# Patient Record
Sex: Male | Born: 2000 | Race: White | Hispanic: No | Smoking: Current some day smoker
Health system: Southern US, Community
[De-identification: ages and names within clinical notes are randomized; demographics above are authoritative.]

## PROBLEM LIST (undated history)

## (undated) HISTORY — PX: KNEE SURGERY: SHX244

---

## 2020-12-29 ENCOUNTER — Emergency Department (HOSPITAL_COMMUNITY)
Admission: EM | Admit: 2020-12-29 | Discharge: 2020-12-30 | Disposition: A | Discharge status: Home / Self Care | Payer: Self-pay | Attending: Emergency Medicine | Admitting: Emergency Medicine

## 2020-12-29 ENCOUNTER — Encounter (HOSPITAL_COMMUNITY): Payer: Self-pay | Admitting: *Deleted

## 2020-12-29 ENCOUNTER — Other Ambulatory Visit: Payer: Self-pay

## 2020-12-29 DIAGNOSIS — F17200 Nicotine dependence, unspecified, uncomplicated: Secondary | ICD-10-CM | POA: Insufficient documentation

## 2020-12-29 DIAGNOSIS — N200 Calculus of kidney: Secondary | ICD-10-CM | POA: Insufficient documentation

## 2020-12-29 LAB — CBC
HCT: 51.9 % (ref 39.0–52.0)
Hemoglobin: 16.8 g/dL (ref 13.0–17.0)
MCH: 26.3 pg (ref 26.0–34.0)
MCHC: 32.4 g/dL (ref 30.0–36.0)
MCV: 81.3 fL (ref 80.0–100.0)
Platelets: 275 10*3/uL (ref 150–400)
RBC: 6.38 MIL/uL — ABNORMAL HIGH (ref 4.22–5.81)
RDW: 12.6 % (ref 11.5–15.5)
WBC: 11 10*3/uL — ABNORMAL HIGH (ref 4.0–10.5)
nRBC: 0 % (ref 0.0–0.2)

## 2020-12-29 LAB — COMPREHENSIVE METABOLIC PANEL
ALT: 17 U/L (ref 0–44)
AST: 20 U/L (ref 15–41)
Albumin: 4.7 g/dL (ref 3.5–5.0)
Alkaline Phosphatase: 126 U/L (ref 38–126)
Anion gap: 9 (ref 5–15)
BUN: 10 mg/dL (ref 6–20)
CO2: 26 mmol/L (ref 22–32)
Calcium: 10.2 mg/dL (ref 8.9–10.3)
Chloride: 104 mmol/L (ref 98–111)
Creatinine, Ser: 0.83 mg/dL (ref 0.61–1.24)
GFR, Estimated: >60 mL/min (ref >60)
Glucose, Bld: 101 mg/dL — ABNORMAL HIGH (ref 70–99)
Potassium: 4 mmol/L (ref 3.5–5.1)
Sodium: 139 mmol/L (ref 135–145)
Total Bilirubin: 1.1 mg/dL (ref 0.3–1.2)
Total Protein: 8 g/dL (ref 6.5–8.1)

## 2020-12-29 LAB — URINALYSIS, ROUTINE W REFLEX MICROSCOPIC
Bilirubin Urine: NEGATIVE
Glucose, UA: NEGATIVE mg/dL
Ketones, ur: 80 mg/dL — AB
Leukocytes,Ua: NEGATIVE
Nitrite: NEGATIVE
Protein, ur: 30 mg/dL — AB
Specific Gravity, Urine: 1.03 (ref 1.005–1.030)
pH: 5 (ref 5.0–8.0)

## 2020-12-29 NOTE — ED Triage Notes (Signed)
Pt reports two days of intermittent right side back pain that radiates to abd. Denies urinary symptoms.

## 2020-12-30 ENCOUNTER — Emergency Department (HOSPITAL_COMMUNITY): Payer: Self-pay

## 2020-12-30 MED ORDER — SODIUM CHLORIDE 0.9 % BOLUS PEDS
1000.0000 mL | Freq: Once | INTRAVENOUS | Status: AC
  Administered 2020-12-30: 1000 mL via INTRAVENOUS

## 2020-12-30 MED ORDER — KETOROLAC TROMETHAMINE 30 MG/ML IJ SOLN
30.0000 mg | Freq: Once | INTRAMUSCULAR | Status: AC
  Administered 2020-12-30: 30 mg via INTRAVENOUS
  Filled 2020-12-30: qty 1

## 2020-12-30 MED ORDER — DICLOFENAC SODIUM ER 100 MG PO TB24: 100.0000 mg | ORAL_TABLET | Freq: Every day | ORAL | 1 refills | Status: AC

## 2020-12-30 MED ORDER — TAMSULOSIN HCL 0.4 MG PO CAPS: 0.4000 mg | ORAL_CAPSULE | Freq: Every day | ORAL | 1 refills | Status: AC

## 2020-12-30 NOTE — Discharge Instructions (Addendum)
Please return to the [facility] ED should you develop difficulty urinating, fevers, chills, severe abdominal pain.  Please follow-up with urologist listed if not improved within 3 to 4 days.

## 2020-12-30 NOTE — ED Provider Notes (Signed)
[facility] EMERGENCY DEPARTMENT Provider Note   CSN: [id number] Arrival date & time: 12/29/20  1528     History Chief Complaint  Patient presents with  . Back Pain  . Flank Pain    Darrell Zamora is a 20 y.o. male.  20 year old male who presents for 2 days of intermittent right-sided flank pain that radiates forward to the bladder.  Patient has not noticed any blood in his urine.  No dysuria.  No history of kidney stones.  No fevers.  No vomiting.  No abdominal pain.  Pain is intermittent.  Pain is helped by ibuprofen.  Ibuprofen wears off after approximately 4 hours.  No family history of kidney stones known.  The history is provided by the patient. No language interpreter was used.  Back Pain Pain location: right CVA. Quality:  Cramping and shooting Radiates to: right groin. Pain is:  Same all the time Onset quality:  Sudden Duration:  2 days Timing:  Intermittent Progression:  Waxing and waning Chronicity:  New Context: not occupational injury, not physical stress, not recent illness and not recent injury   Relieved by:  Ibuprofen Worsened by:  Nothing Ineffective treatments:  Ibuprofen Associated symptoms: no abdominal swelling, no bladder incontinence, no bowel incontinence, no chest pain, no dysuria, no fever, no headaches, no numbness, no paresthesias, no weakness and no weight loss   Risk factors: not obese and no steroid use   Flank Pain Pertinent negatives include no chest pain and no headaches.       History reviewed. No pertinent past medical history.  There are no problems to display for this patient.   History reviewed. No pertinent surgical history.     History reviewed. No pertinent family history.  Social History   Tobacco Use  . Smoking status: Current Some Day Smoker  Substance Use Topics  . Alcohol use: Never  . Drug use: Yes    Types: Marijuana    Home Medications Prior to Admission medications   Medication Sig  Start Date End Date Taking? Authorizing Provider  Diclofenac Sodium CR 100 MG 24 hr tablet Take 1 tablet (100 mg total) by mouth daily. 12/30/20  Yes Niel Hummer, MD  tamsulosin (FLOMAX) 0.4 MG CAPS capsule Take 1 capsule (0.4 mg total) by mouth daily for 28 days. 12/30/20 01/27/21 Yes Niel Hummer, MD    Allergies    Patient has no known allergies.  Review of Systems   Review of Systems  Constitutional: Negative for fever and weight loss.  Cardiovascular: Negative for chest pain.  Gastrointestinal: Negative for bowel incontinence.  Genitourinary: Positive for flank pain. Negative for bladder incontinence and dysuria.  Musculoskeletal: Positive for back pain.  Neurological: Negative for weakness, numbness, headaches and paresthesias.  All other systems reviewed and are negative.   Physical Exam Updated Vital Signs BP 134/73 (BP Location: Right Arm)   Pulse 75   Temp 98 F (36.7 C) (Oral)   Resp 16   SpO2 99%   Physical Exam Vitals and nursing note reviewed.  Constitutional:      Appearance: He is well-developed and well-nourished.  HENT:     Head: Normocephalic.     Right Ear: External ear normal.     Left Ear: External ear normal.     Mouth/Throat:     Mouth: Oropharynx is clear and moist.  Eyes:     Extraocular Movements: EOM normal.     Conjunctiva/sclera: Conjunctivae normal.  Cardiovascular:     Rate and  Rhythm: Normal rate.     Pulses: Intact distal pulses.     Heart sounds: Normal heart sounds.  Pulmonary:     Effort: Pulmonary effort is normal.     Breath sounds: Normal breath sounds.  Abdominal:     General: Bowel sounds are normal.     Palpations: Abdomen is soft.     Tenderness: There is abdominal tenderness. There is right CVA tenderness.     Comments: Mild tenderness to palpation along the right groin.  Patient also with pain to palpation of the right CVA.  Musculoskeletal:        General: Normal range of motion.     Cervical back: Normal range of  motion and neck supple.  Skin:    General: Skin is warm and dry.  Neurological:     Mental Status: He is alert and oriented to person, place, and time.     ED Results / Procedures / Treatments   Labs (all labs ordered are listed, but only abnormal results are displayed) Labs Reviewed  COMPREHENSIVE METABOLIC PANEL - Abnormal; Notable for the following components:      Result Value   Glucose, Bld 101 (*)    All other components within normal limits  CBC - Abnormal; Notable for the following components:   WBC 11.0 (*)    RBC 6.38 (*)    All other components within normal limits  URINALYSIS, ROUTINE W REFLEX MICROSCOPIC - Abnormal; Notable for the following components:   APPearance HAZY (*)    Hgb urine dipstick LARGE (*)    Ketones, ur 80 (*)    Protein, ur 30 (*)    Bacteria, UA RARE (*)    All other components within normal limits    EKG None  Radiology CT Renal Stone Study  Result Date: 12/30/2020 CLINICAL DATA:  Intermittent right back pain radiating to the abdomen. EXAM: CT ABDOMEN AND PELVIS WITHOUT CONTRAST TECHNIQUE: Multidetector CT imaging of the abdomen and pelvis was performed following the standard protocol without IV contrast. COMPARISON:  None. FINDINGS: Lower chest: The lung bases are clear. Hepatobiliary: No focal liver abnormality is seen. No gallstones, gallbladder wall thickening, or biliary dilatation. Pancreas: Unremarkable. No pancreatic ductal dilatation or surrounding inflammatory changes. Spleen: Normal in size without focal abnormality. Adrenals/Urinary Tract: No adrenal gland nodules. Kidneys are symmetrical. Mild right hydronephrosis without significant hydroureter. Stone in the distal right ureter at the ureterovesical junction measuring 2 mm diameter. Left kidney is normal. No bladder wall thickening or filling defects. Stomach/Bowel: Stomach is within normal limits. Appendix is not identified. No evidence of bowel wall thickening, distention, or  inflammatory changes. Vascular/Lymphatic: No significant vascular findings are present. No enlarged abdominal or pelvic lymph nodes. Reproductive: Prostate is unremarkable. Other: No abdominal wall hernia or abnormality. No abdominopelvic ascites. Musculoskeletal: No acute or significant osseous findings. IMPRESSION: 2 mm stone in the distal right ureter with mild proximal obstruction. Electronically Signed   By: Burman Nieves M.D.   On: 12/30/2020 02:46    Procedures Procedures   Medications Ordered in ED Medications  0.9% NaCl bolus PEDS (0 mLs Intravenous Stopped 12/30/20 0454)  ketorolac (TORADOL) 30 MG/ML injection 30 mg (30 mg Intravenous Given 12/30/20 0353)    ED Course  I have reviewed the triage vital signs and the nursing notes.  Pertinent labs & imaging results that were available during my care of the patient were reviewed by me and considered in my medical decision making (see chart for details).  MDM Rules/Calculators/A&P                          20 year old with right CVA tenderness radiating to the right groin.  No history of kidney stones but strong concern.  Will obtain CBC, CMP to evaluate renal function, will obtain CT.  Will obtain UA.  Will give a dose of Toradol and a normal saline bolus.  UA does show large blood, and 21-50 RBC.  No signs of infection.  Patient with normal renal function.  CT visualized by me patient noted to have a 2 mm mildly obstructing stone in the right UVJ.  Patient feeling better after IV fluid bolus and Toradol.  Will discharge home with Flomax, and Voltaren XR.  Will have patient strain urine.  Will have patient follow-up with urology.  Discussed signs that warrant reevaluation, encouraged patient to follow-up at [facility] if needed.  Dayron Coye was evaluated in Emergency Department on 12/30/2020 for the symptoms described in the history of present illness. He was evaluated in the context of the global COVID-19 pandemic,  which necessitated consideration that the patient might be at risk for infection with the SARS-CoV-2 virus that causes COVID-19. Institutional protocols and algorithms that pertain to the evaluation of patients at risk for COVID-19 are in a state of rapid change based on information released by regulatory bodies including the CDC and federal and state organizations. These policies and algorithms were followed during the patient's care in the ED.      Final Clinical Impression(s) / ED Diagnoses Final diagnoses:  Kidney stone on right side    Rx / DC Orders ED Discharge Orders         Ordered    tamsulosin (FLOMAX) 0.4 MG CAPS capsule  Daily        12/30/20 0444    Diclofenac Sodium CR 100 MG 24 hr tablet  Daily        12/30/20 0444           Niel Hummer, MD 12/30/20 0522

## 2021-06-27 ENCOUNTER — Other Ambulatory Visit: Payer: Self-pay

## 2021-06-27 ENCOUNTER — Emergency Department (HOSPITAL_COMMUNITY): Payer: PRIVATE HEALTH INSURANCE

## 2021-06-27 ENCOUNTER — Emergency Department (HOSPITAL_COMMUNITY)
Admission: EM | Admit: 2021-06-27 | Discharge: 2021-06-28 | Disposition: A | Discharge status: Home / Self Care | Payer: PRIVATE HEALTH INSURANCE | Attending: Emergency Medicine | Admitting: Emergency Medicine

## 2021-06-27 DIAGNOSIS — Y9367 Activity, basketball: Secondary | ICD-10-CM | POA: Diagnosis not present

## 2021-06-27 DIAGNOSIS — M7989 Other specified soft tissue disorders: Secondary | ICD-10-CM | POA: Diagnosis not present

## 2021-06-27 DIAGNOSIS — S93401A Sprain of unspecified ligament of right ankle, initial encounter: Secondary | ICD-10-CM | POA: Insufficient documentation

## 2021-06-27 DIAGNOSIS — X509XXA Other and unspecified overexertion or strenuous movements or postures, initial encounter: Secondary | ICD-10-CM | POA: Diagnosis not present

## 2021-06-27 DIAGNOSIS — F17200 Nicotine dependence, unspecified, uncomplicated: Secondary | ICD-10-CM | POA: Insufficient documentation

## 2021-06-27 DIAGNOSIS — S99911A Unspecified injury of right ankle, initial encounter: Secondary | ICD-10-CM | POA: Diagnosis present

## 2021-06-27 NOTE — ED Provider Notes (Signed)
Emergency Medicine Provider Triage Evaluation Note  Darrell Zamora , a 20 y.o. male  was evaluated in triage.  Pt complains of right ankle pain.  Patient was playing basketball and after jumping he landed on a friend's foot causing him to invert the right ankle.  Reports pain and swelling in the region.  Mild tingling radiating in the right foot.  No numbness.  No other regions of pain.  Physical Exam  BP 130/76   Pulse (!) 109   Temp 98.4 F (36.9 C) (Oral)   Resp 16   Ht 5\' 5"  (1.651 m)   Wt 48.1 kg   SpO2 99%   BMI 17.64 kg/m  Gen:   Awake, no distress   Resp:  Normal effort  MSK:   Moves extremities without difficulty  Other:  Exquisite tenderness with moderate swelling noted to the lateral malleolus of the right ankle.  Distal sensation intact.  2+ pedal pulses.  No proximal tib/fib tenderness appreciated.  Medical Decision Making  Medically screening exam initiated at 5:16 PM.  Appropriate orders placed.  Darrell Zamora was informed that the remainder of the evaluation will be completed by another provider, this initial triage assessment does not replace that evaluation, and the importance of remaining in the ED until their evaluation is complete.   Darrell Liverpool, PA-C 06/27/21 1717    06/29/21, MD 06/28/21 1109

## 2021-06-27 NOTE — ED Triage Notes (Signed)
Pt reports R ankle pain since coming down wrong on it during basketball game just PTA.

## 2021-06-28 MED ORDER — TRAMADOL HCL 50 MG PO TABS
50.0000 mg | ORAL_TABLET | Freq: Once | ORAL | Status: AC
  Administered 2021-06-28: 50 mg via ORAL
  Filled 2021-06-28: qty 1

## 2021-06-28 MED ORDER — NAPROXEN 250 MG PO TABS
500.0000 mg | ORAL_TABLET | Freq: Once | ORAL | Status: AC
  Administered 2021-06-28: 500 mg via ORAL
  Filled 2021-06-28: qty 2

## 2021-06-28 NOTE — Discharge Instructions (Signed)
Apply ice to areas of injury 3-4 times per day to limit inflammation/swelling.  Use crutches over the next 2-3 days to prevent from putting weight on your right ankle. Take 600mg  ibuprofen ever 6 hours for pain. Follow up with Orthopedics to ensure proper healing. Return to the ED for any new or concerning symptoms.

## 2021-06-28 NOTE — ED Provider Notes (Signed)
[facility] EMERGENCY DEPARTMENT Provider Note   CSN: [id number] Arrival date & time: 06/27/21  1630     History Chief Complaint  Patient presents with   Ankle Pain    Jonaven Kolar is a 20 y.o. male.  The history is provided by the patient. No language interpreter was used.  Ankle Pain Location:  Ankle Injury: yes   Mechanism of injury comment:  Fell and rolled ankle playing basketball Ankle location:  R ankle Pain details:    Quality:  Sharp and aching   Severity:  Moderate   Timing:  Constant   Progression:  Unchanged Chronicity:  New Prior injury to area:  No Relieved by:  Nothing Worsened by:  Bearing weight Ineffective treatments:  Rest Associated symptoms: decreased ROM, swelling and tingling   Associated symptoms: no numbness   Risk factors: no known bone disorder and no obesity       No past medical history on file.  There are no problems to display for this patient.   No past surgical history on file.     No family history on file.  Social History   Tobacco Use   Smoking status: Some Days  Substance Use Topics   Alcohol use: Never   Drug use: Yes    Types: Marijuana    Home Medications Prior to Admission medications   Medication Sig Start Date End Date Taking? Authorizing Provider  Diclofenac Sodium CR 100 MG 24 hr tablet Take 1 tablet (100 mg total) by mouth daily. 12/30/20   Niel Hummer, MD    Allergies    Patient has no known allergies.  Review of Systems   Review of Systems Ten systems reviewed and are negative for acute change, except as noted in the HPI.    Physical Exam Updated Vital Signs BP 130/88   Pulse 89   Temp 98.4 F (36.9 C) (Oral)   Resp 17   Ht 5\' 5"  (1.651 m)   Wt 48.1 kg   SpO2 98%   BMI 17.64 kg/m   Physical Exam Vitals and nursing note reviewed.  Constitutional:      General: He is not in acute distress.    Appearance: He is well-developed. He is not diaphoretic.     Comments:  Nontoxic appearing and in NAD  HENT:     Head: Normocephalic and atraumatic.  Eyes:     General: No scleral icterus.    Conjunctiva/sclera: Conjunctivae normal.  Cardiovascular:     Rate and Rhythm: Normal rate and regular rhythm.     Pulses: Normal pulses.     Comments: DP pulse 2+ in the RLE Pulmonary:     Effort: Pulmonary effort is normal. No respiratory distress.  Musculoskeletal:        General: Normal range of motion.     Cervical back: Normal range of motion.     Right ankle: Swelling present. No deformity. Tenderness present over the lateral malleolus.     Right Achilles Tendon: Normal.  Skin:    General: Skin is warm and dry.     Coloration: Skin is not pale.     Findings: No erythema or rash.  Neurological:     Mental Status: He is alert and oriented to person, place, and time.     Coordination: Coordination normal.     Comments: Sensation to light touch intact in the R foot. Patient able to wiggle all toes.  Psychiatric:        Behavior:  Behavior normal.    ED Results / Procedures / Treatments   Labs (all labs ordered are listed, but only abnormal results are displayed) Labs Reviewed - No data to display  EKG None  Radiology DG Ankle Complete Right  Result Date: 06/27/2021 CLINICAL DATA:  Fall with ankle pain. EXAM: RIGHT ANKLE - COMPLETE 3+ VIEW COMPARISON:  None. FINDINGS: A 3 mm ossific fragment inferior to the lateral malleolus may represent an avulsion fracture of the lateral malleolus. There is associated soft tissue swelling. No joint dislocation. IMPRESSION: Fragment inferior to the lateral malleolus may represent an avulsion fracture. Electronically Signed   By: Romona Curls M.D.   On: 06/27/2021 17:39    Procedures Procedures   Medications Ordered in ED Medications  traMADol (ULTRAM) tablet 50 mg (has no administration in time range)  naproxen (NAPROSYN) tablet 500 mg (has no administration in time range)    ED Course  I have reviewed the  triage vital signs and the nursing notes.  Pertinent labs & imaging results that were available during my care of the patient were reviewed by me and considered in my medical decision making (see chart for details).    MDM Rules/Calculators/A&P                           Patient presents to the emergency department for evaluation of R ankle pain. Patient neurovascularly intact on exam. Imaging negative for dislocation, bony deformity. Suspect very small avulsion fx associated with ligament injury. Compartments in the affected extremity are soft. Plan for supportive management including RICE NSAIDs, ASO, and crutches for WBAT; Orthopedic follow up as needed. Return precautions discussed and provided. Patient discharged in stable condition with no unaddressed concerns.   Final Clinical Impression(s) / ED Diagnoses Final diagnoses:  Sprain of right ankle, unspecified ligament, initial encounter    Rx / DC Orders ED Discharge Orders     None        Antony Madura, PA-C 06/28/21 0305    Palumbo, April, MD 06/28/21 5188

## 2021-09-30 IMAGING — CT CT RENAL STONE PROTOCOL
2 of 4 series · 16 of 46 positions shown, 18 images · non-contrast
Comparison: None.

CLINICAL DATA: Intermittent right back pain radiating to the
abdomen.

EXAM:
CT ABDOMEN AND PELVIS WITHOUT CONTRAST
TECHNIQUE: Multidetector CT imaging of the abdomen and pelvis was performed
following the standard protocol without IV contrast.

[Series 3: stone study 5.0 i30f 2 · axial · 0.61mm/px · z∈[+716,+1086]mm · 13 of 82 slices shown, 15 images]
[im 4/82  soft-tissue]
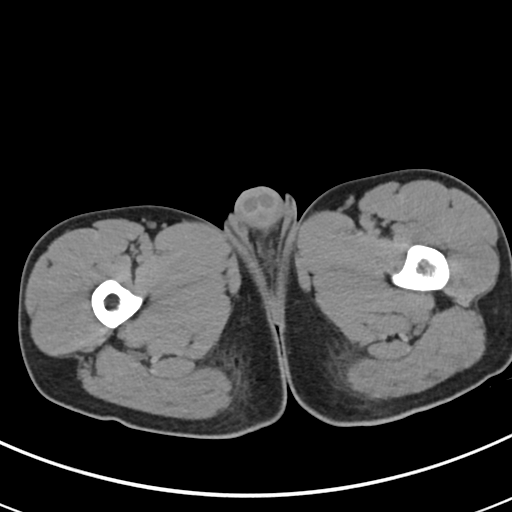
[im 4/82  bone]
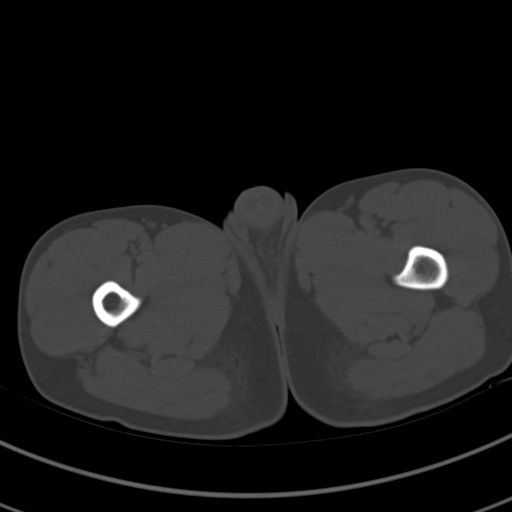
[im 10/82  soft-tissue]
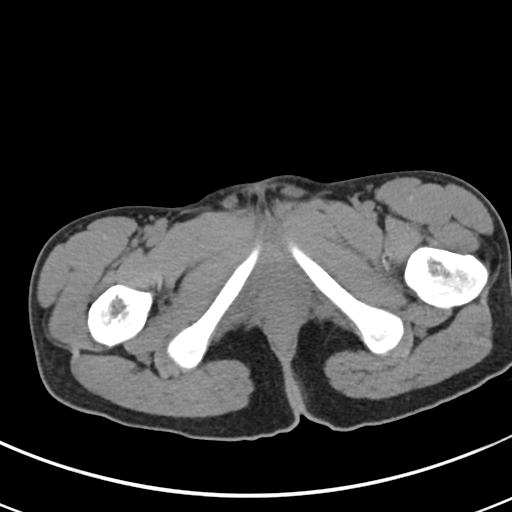
[im 17/82  soft-tissue]
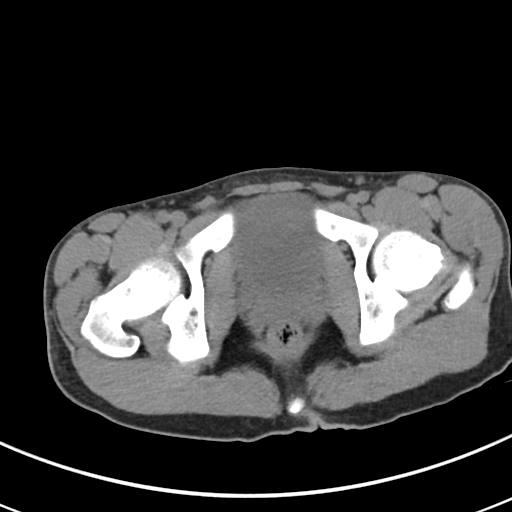
[im 23/82  soft-tissue]
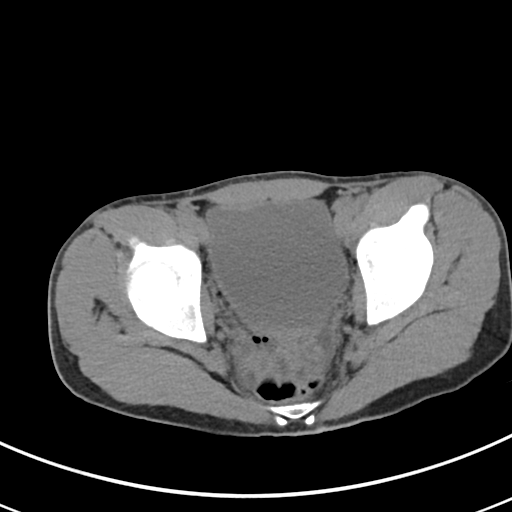
[im 30/82  soft-tissue]
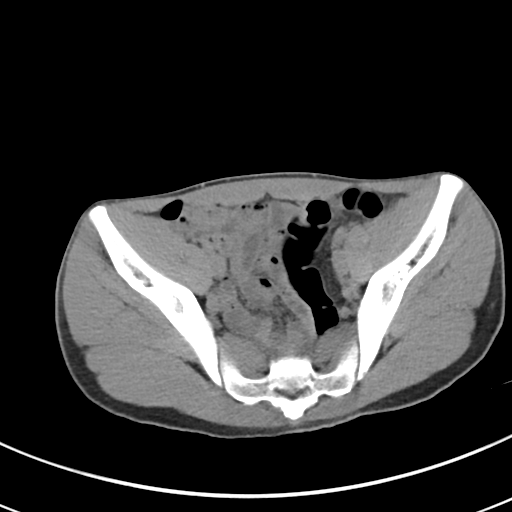
[im 36/82  soft-tissue]
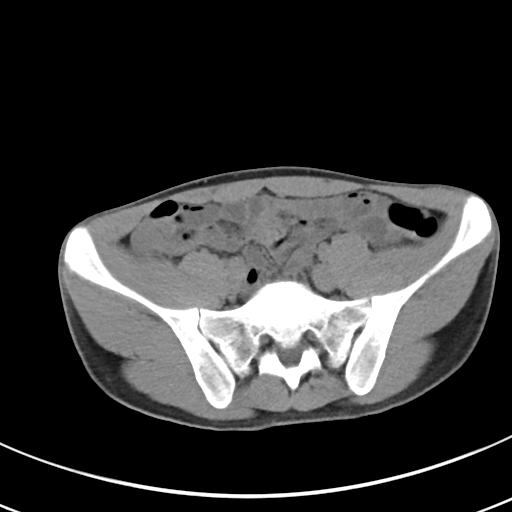
[im 43/82  soft-tissue]
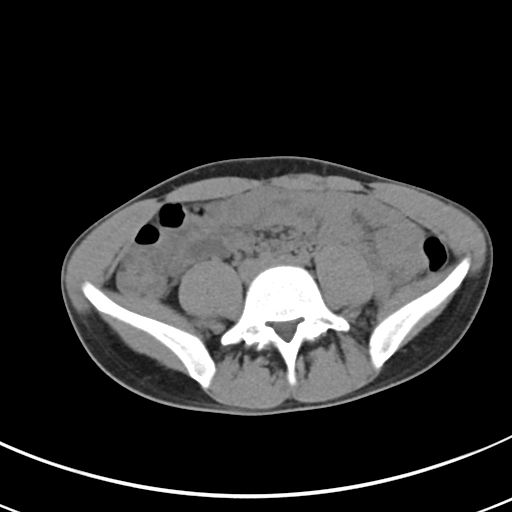
[im 46/82  soft-tissue]
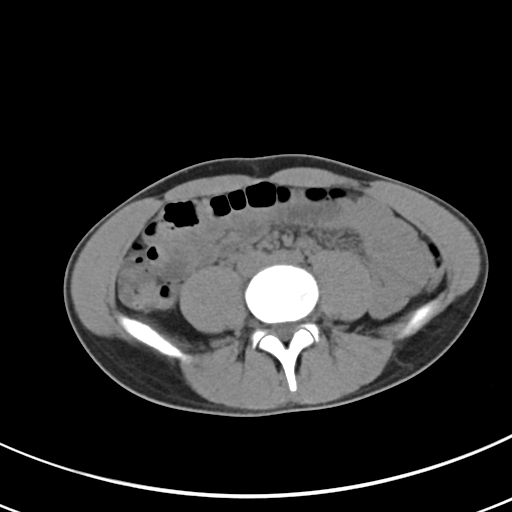
[im 52/82  soft-tissue]
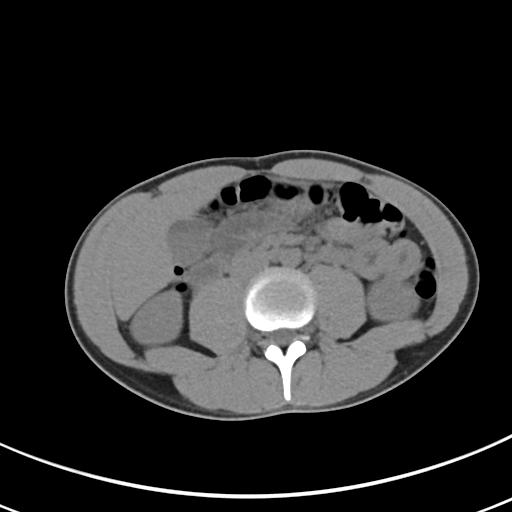
[im 52/82  bone]
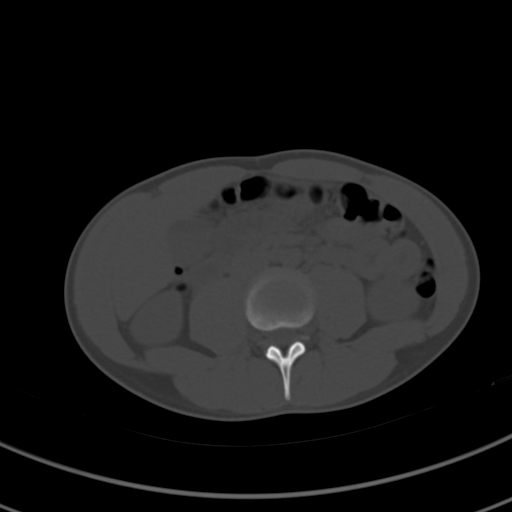
[im 59/82  soft-tissue]
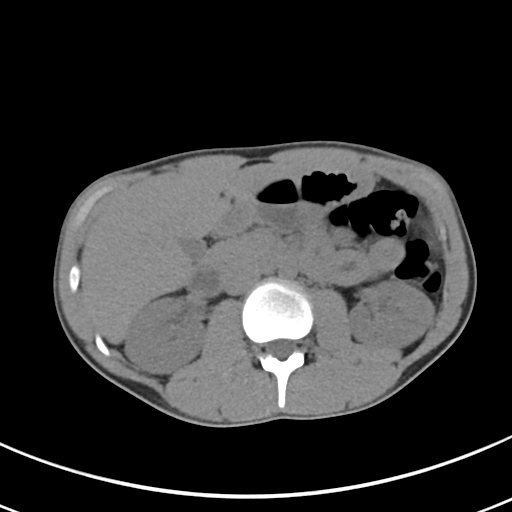
[im 65/82  soft-tissue]
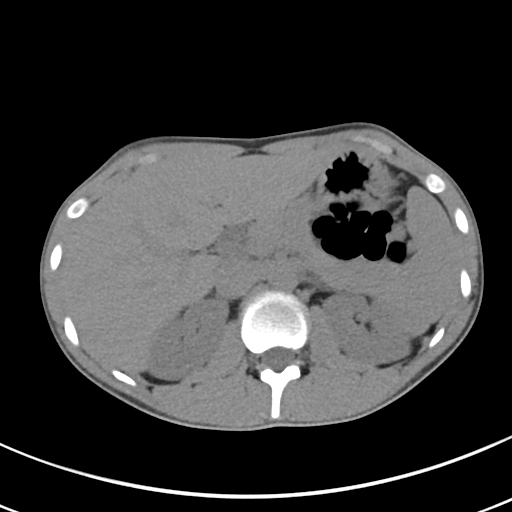
[im 72/82  soft-tissue]
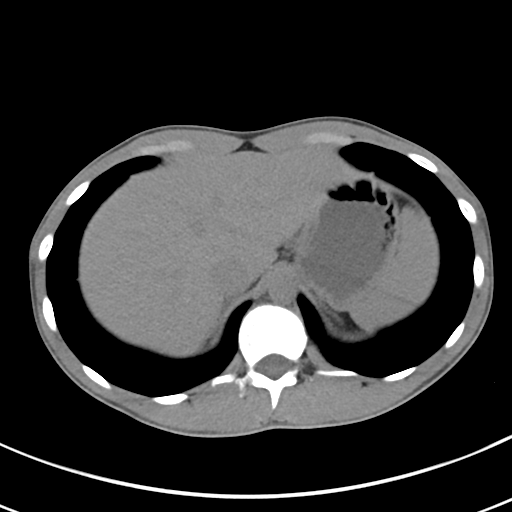
[im 78/82  soft-tissue]
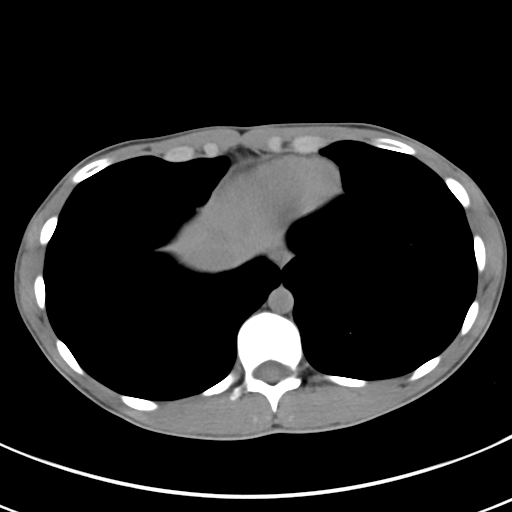

[Series 6: coronal soft tissue · coronal · 0.67mm/px · 3 of 65 slices shown]
[im 29/65  soft-tissue]
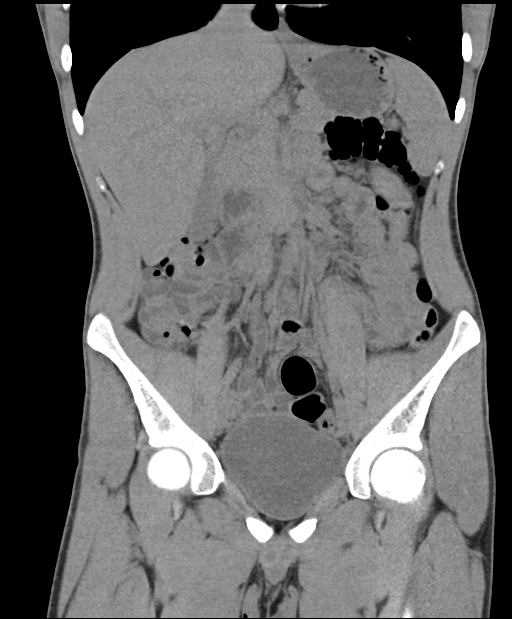
[im 36/65  soft-tissue]
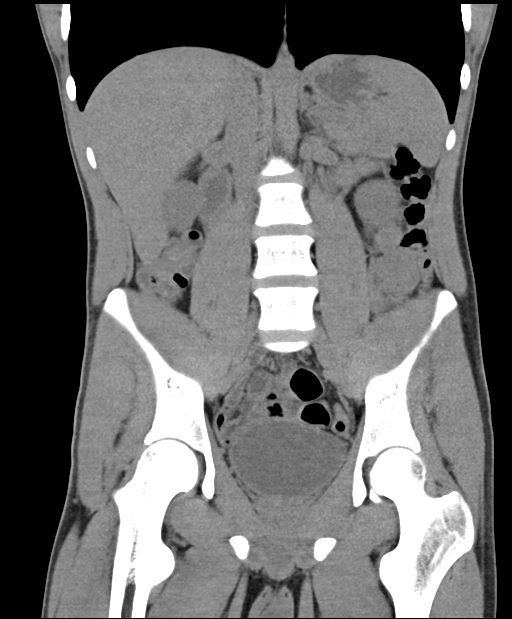
[im 43/65  soft-tissue]
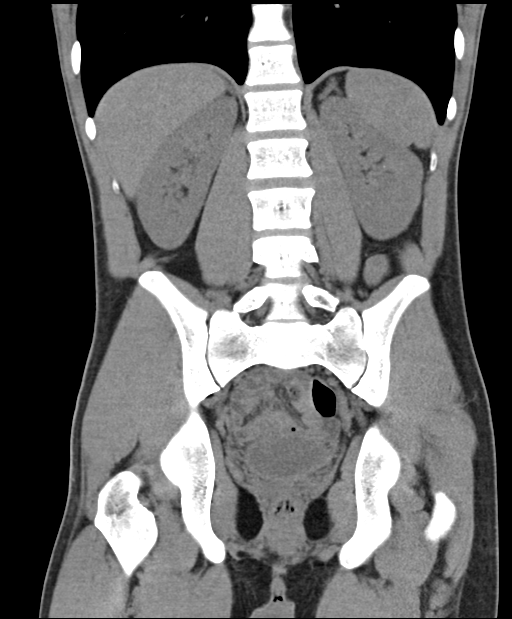

[16 of 46 positions shown; findings below may reference images not displayed]

FINDINGS: Lower chest: The lung bases are clear.

Hepatobiliary: No focal liver abnormality is seen. No gallstones,
gallbladder wall thickening, or biliary dilatation.

Pancreas: Unremarkable. No pancreatic ductal dilatation or
surrounding inflammatory changes.

Spleen: Normal in size without focal abnormality.

Adrenals/Urinary Tract: No adrenal gland nodules. Kidneys are
symmetrical. Mild right hydronephrosis without significant
hydroureter. Stone in the distal right ureter at the ureterovesical
junction measuring 2 mm diameter. Left kidney is normal. No bladder
wall thickening or filling defects.

Stomach/Bowel: Stomach is within normal limits. Appendix is not
identified. No evidence of bowel wall thickening, distention, or
inflammatory changes.

Vascular/Lymphatic: No significant vascular findings are present. No
enlarged abdominal or pelvic lymph nodes.

Reproductive: Prostate is unremarkable.

Other: No abdominal wall hernia or abnormality. No abdominopelvic
ascites.

Musculoskeletal: No acute or significant osseous findings.
IMPRESSION: 2 mm stone in the distal right ureter with mild proximal
obstruction.

## 2022-03-28 IMAGING — DX DG ANKLE COMPLETE 3+V*R*
3 series · 3 of 3 positions shown · non-contrast
Comparison: None.

CLINICAL DATA: Fall with ankle pain.

EXAM:
RIGHT ANKLE - COMPLETE 3+ VIEW

[ankle ap]
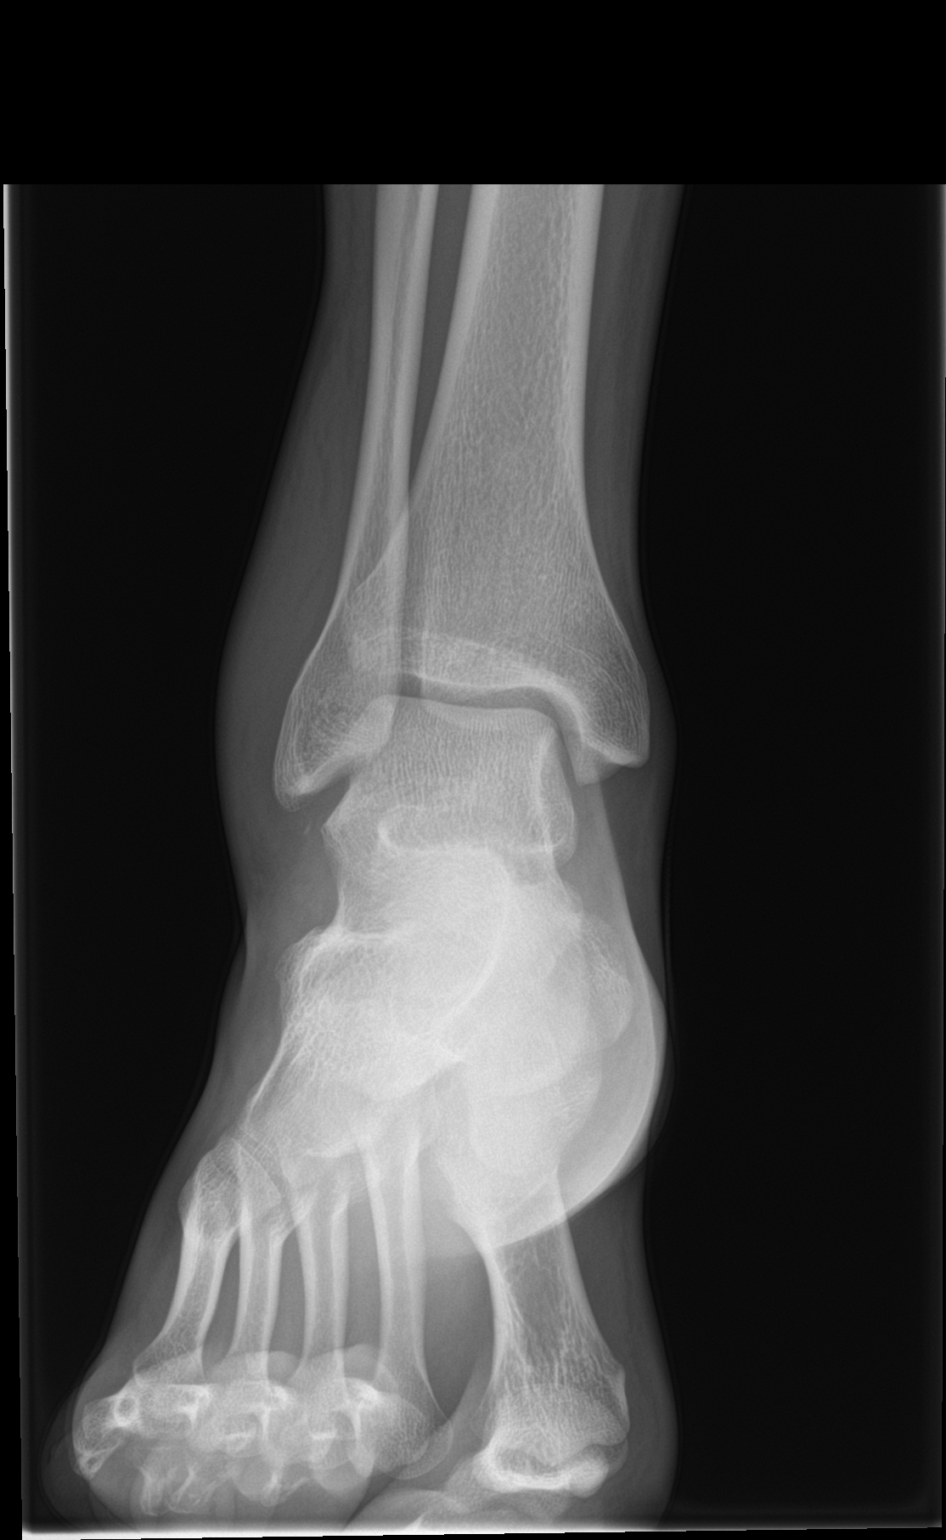

[ankle obl]
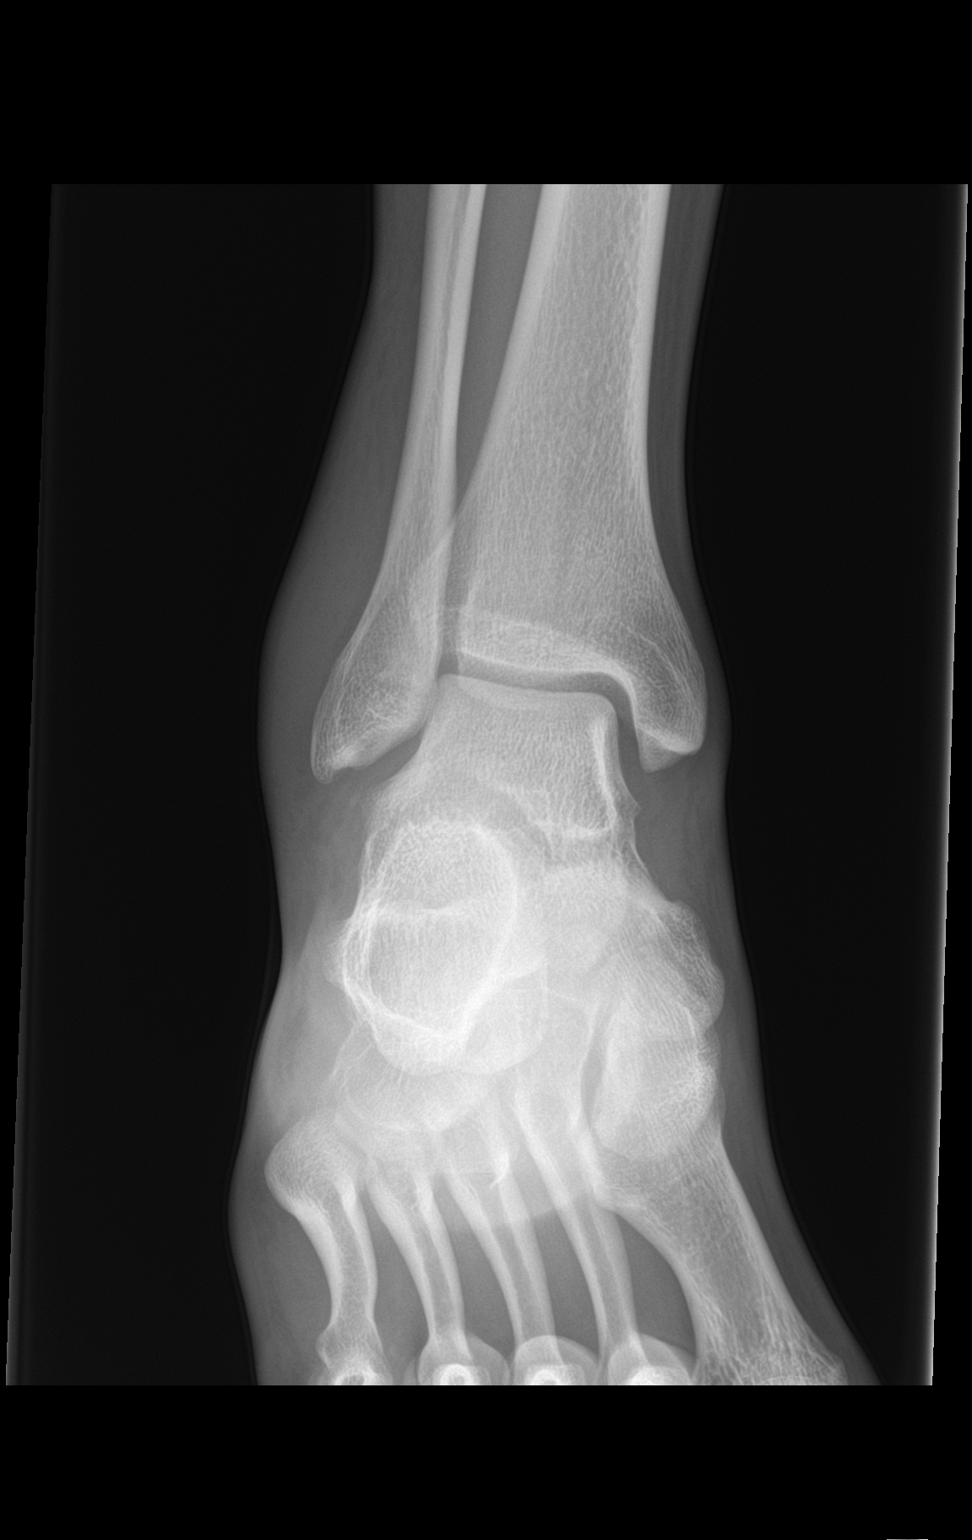

[ankle lat]
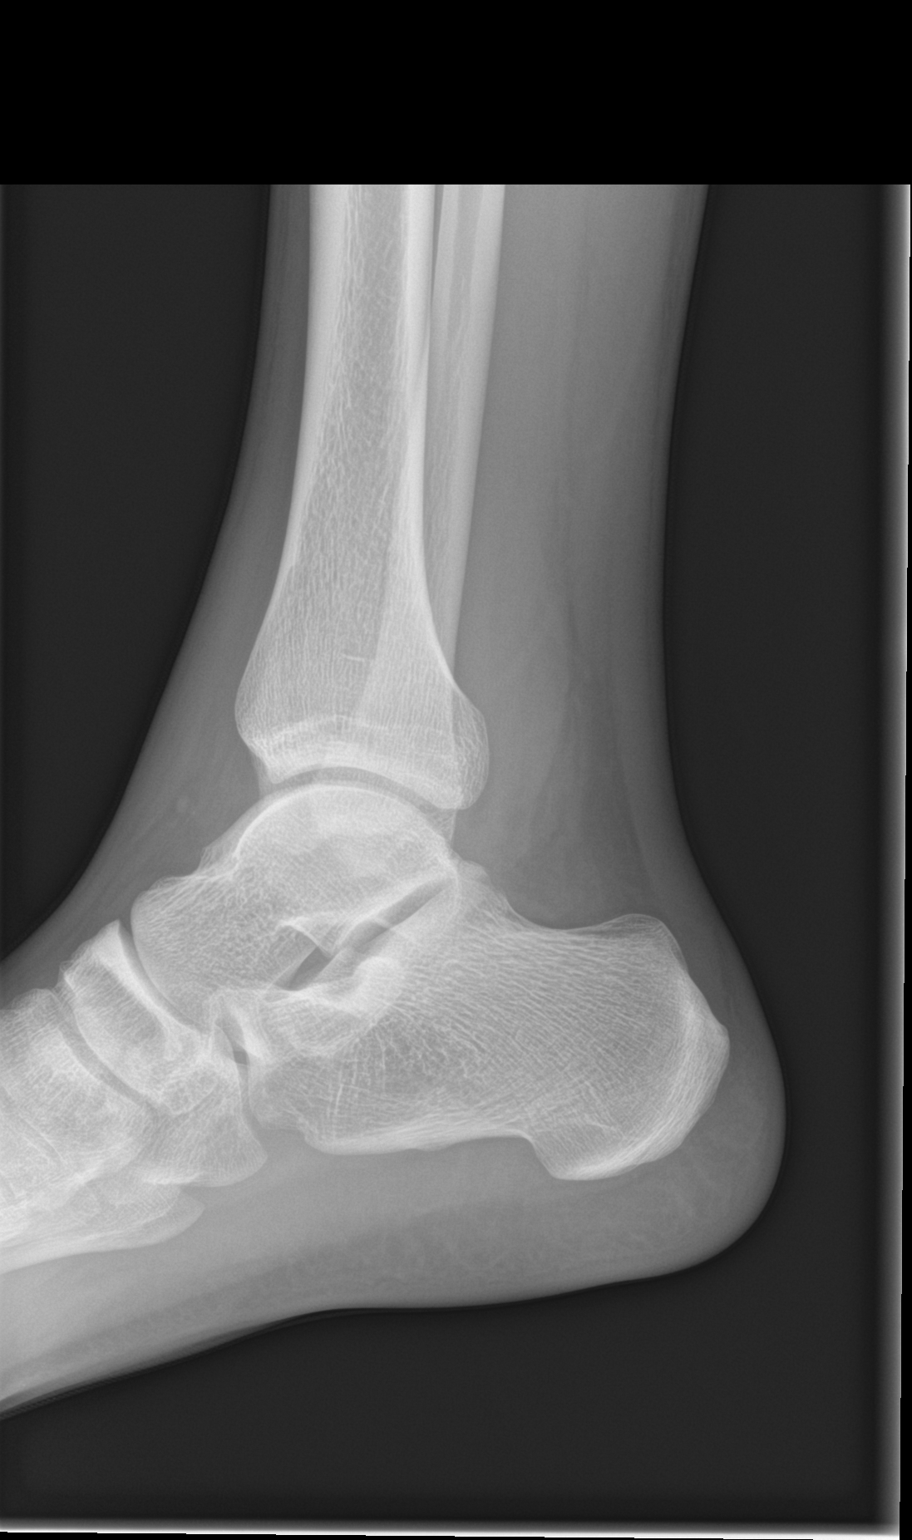

[3 of 3 positions shown; findings below may reference images not displayed]

FINDINGS: A 3 mm ossific fragment inferior to the lateral malleolus may
represent an avulsion fracture of the lateral malleolus. There is
associated soft tissue swelling. No joint dislocation.
IMPRESSION: Fragment inferior to the lateral malleolus may represent an avulsion
fracture.

## 2022-04-26 ENCOUNTER — Ambulatory Visit (HOSPITAL_COMMUNITY)
Admission: EM | Admit: 2022-04-26 | Discharge: 2022-04-26 | Disposition: A | Discharge status: Home / Self Care | Payer: No Payment, Other | Attending: Psychiatry | Admitting: Psychiatry

## 2022-04-26 DIAGNOSIS — R454 Irritability and anger: Secondary | ICD-10-CM | POA: Insufficient documentation

## 2022-04-26 DIAGNOSIS — F32A Depression, unspecified: Secondary | ICD-10-CM | POA: Insufficient documentation

## 2022-04-26 DIAGNOSIS — F4323 Adjustment disorder with mixed anxiety and depressed mood: Secondary | ICD-10-CM

## 2022-04-26 NOTE — BH Assessment (Signed)
Patient is a 21 year old male that presents this date requesting possible resources to assist with ongoing irritability. Patient denies any S/I, H/I or AVH. Patient has recently started groups through probation and this was his second group today. Patient stated his counselor notice he was "short with everyone in group" which is not like him and they suggested he come in for an evaluation. Patient denies any prior psychiatric diagnosis or history of self harm. Patient states he also needs help with ongoing SA issues reporting he smokes Cannabis in various amounts 1 to 2 times a week although it is of concern since he is on probation.

## 2022-04-26 NOTE — ED Notes (Signed)
Discharge instructions provided and Pt stated understanding. Pt alert, orient and ambulatory prior to d/c from facility. Personal belongings returned. Pt escorted to the front lobby. Safety maintained.

## 2022-04-26 NOTE — Discharge Instructions (Addendum)
The suicide prevention education provided includes the following: Suicide risk factors Suicide prevention and interventions National Suicide Hotline telephone number [facility] assessment telephone number [facility] Emergency Assistance [address] [facility] and/or Residential Mobile Crisis Unit telephone number   Request made of family/significant other to: Remove weapons (e.g., guns, rifles, knives), all items previously/currently identified as safety concern.   Remove drugs/medications (over the counter, prescriptions, illicit drugs), all items previously/currently identified as a safety concern.    Please contact one of the following facilities to start medication management and therapy services:   [facility] [address]  [place], [place] [address] [phone]   [facility]  [address] [place], [place] [address] [phone]  [facility] [place]  [address]ella Raring [place], [place] [address] [phone]  [facility]  [address]  [place], [place] [address] [phone]  [facility]  [address] [place], [place] [address] [phone]  [facility]  [address] Renee Rival  [place], [place] [address] [phone]  Please present to one of the following facilities to start medication management and therapy services:   [facility] Building [address],  [place], [place] [address] [phone]   [facility]  [address]  [place], [place] [address] [phone]
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
## 2022-07-25 ENCOUNTER — Emergency Department (HOSPITAL_BASED_OUTPATIENT_CLINIC_OR_DEPARTMENT_OTHER)
Admission: EM | Admit: 2022-07-25 | Discharge: 2022-07-25 | Disposition: A | Discharge status: Home / Self Care | Payer: BC Managed Care – PPO | Attending: Emergency Medicine | Admitting: Emergency Medicine

## 2022-07-25 ENCOUNTER — Emergency Department (HOSPITAL_BASED_OUTPATIENT_CLINIC_OR_DEPARTMENT_OTHER): Payer: BC Managed Care – PPO

## 2022-07-25 ENCOUNTER — Encounter (HOSPITAL_BASED_OUTPATIENT_CLINIC_OR_DEPARTMENT_OTHER): Payer: Self-pay

## 2022-07-25 ENCOUNTER — Other Ambulatory Visit: Payer: Self-pay

## 2022-07-25 DIAGNOSIS — R1032 Left lower quadrant pain: Secondary | ICD-10-CM | POA: Insufficient documentation

## 2022-07-25 DIAGNOSIS — R109 Unspecified abdominal pain: Secondary | ICD-10-CM

## 2022-07-25 LAB — BASIC METABOLIC PANEL
Anion gap: 9 (ref 5–15)
BUN: 8 mg/dL (ref 6–20)
CO2: 28 mmol/L (ref 22–32)
Calcium: 10 mg/dL (ref 8.9–10.3)
Chloride: 99 mmol/L (ref 98–111)
Creatinine, Ser: 0.65 mg/dL (ref 0.61–1.24)
GFR, Estimated: >60 mL/min (ref >60)
Glucose, Bld: 115 mg/dL — ABNORMAL HIGH (ref 70–99)
Potassium: 3.4 mmol/L — ABNORMAL LOW (ref 3.5–5.1)
Sodium: 136 mmol/L (ref 135–145)

## 2022-07-25 LAB — CBC
HCT: 47.6 % (ref 39.0–52.0)
Hemoglobin: 15.9 g/dL (ref 13.0–17.0)
MCH: 27.6 pg (ref 26.0–34.0)
MCHC: 33.4 g/dL (ref 30.0–36.0)
MCV: 82.5 fL (ref 80.0–100.0)
Platelets: 248 10*3/uL (ref 150–400)
RBC: 5.77 MIL/uL (ref 4.22–5.81)
RDW: 13.1 % (ref 11.5–15.5)
WBC: 7.4 10*3/uL (ref 4.0–10.5)
nRBC: 0 % (ref 0.0–0.2)

## 2022-07-25 LAB — URINALYSIS, ROUTINE W REFLEX MICROSCOPIC
Bilirubin Urine: NEGATIVE
Glucose, UA: NEGATIVE mg/dL
Hgb urine dipstick: NEGATIVE
Ketones, ur: NEGATIVE mg/dL
Leukocytes,Ua: NEGATIVE
Nitrite: NEGATIVE
Protein, ur: NEGATIVE mg/dL
Specific Gravity, Urine: 1.008 (ref 1.005–1.030)
pH: 6 (ref 5.0–8.0)

## 2022-07-25 NOTE — ED Triage Notes (Signed)
Pt has h/x kidney stones and reports L sided flank pain since Friday.

## 2022-07-25 NOTE — Discharge Instructions (Signed)
return for new or worsening symptoms otherwise Tylenol and Motrin as needed

## 2022-07-25 NOTE — ED Provider Notes (Signed)
[facility] EMERGENCY DEPT Provider Note   CSN: [id number] Arrival date & time: 07/25/22  1635     History  Chief Complaint  Patient presents with   Flank Pain    Darrell Zamora is a 21 y.o. male here for evaluation of possible left-sided kidney stones.  Patient states on Friday he developed some intermittent sharp stabbing pain to his left flank.  Had intermittent pain until yesterday.  He is concerned that he had recurrent kidney stones.  He has no pain currently.  He has not had taking medication at home.  No fever, nausea, vomiting, chest pain, shortness of breath, abdominal pain, dysuria, hematuria.  HPI     Home Medications Prior to Admission medications   Medication Sig Start Date End Date Taking? Authorizing Provider  Diclofenac Sodium CR 100 MG 24 hr tablet Take 1 tablet (100 mg total) by mouth daily. 12/30/20   Niel Hummer, MD      Allergies    Patient has no known allergies.    Review of Systems   Review of Systems  Constitutional: Negative.   HENT: Negative.    Respiratory: Negative.    Genitourinary:  Positive for flank pain (resolved).  All other systems reviewed and are negative.   Physical Exam Updated Vital Signs BP 137/89 (BP Location: Right Arm)   Pulse 100   Temp 98 F (36.7 C)   Resp 12   Ht 5\' 5"  (1.651 m)   Wt 49.9 kg   SpO2 98%   BMI 18.30 kg/m  Physical Exam Vitals and nursing note reviewed.  Constitutional:      General: He is not in acute distress.    Appearance: He is well-developed. He is not ill-appearing, toxic-appearing or diaphoretic.  HENT:     Head: Normocephalic and atraumatic.     Nose: Nose normal.     Mouth/Throat:     Mouth: Mucous membranes are moist.  Eyes:     Pupils: Pupils are equal, round, and reactive to light.  Cardiovascular:     Rate and Rhythm: Normal rate and regular rhythm.     Pulses: Normal pulses.     Heart sounds: Normal heart sounds.  Pulmonary:     Effort: Pulmonary effort is  normal. No respiratory distress.     Breath sounds: Normal breath sounds.  Abdominal:     General: Bowel sounds are normal. There is no distension.     Palpations: Abdomen is soft.     Tenderness: There is no abdominal tenderness. There is no right CVA tenderness, left CVA tenderness, guarding or rebound.     Hernia: No hernia is present.  Musculoskeletal:        General: Normal range of motion.     Cervical back: Normal range of motion and neck supple.  Skin:    General: Skin is warm and dry.     Capillary Refill: Capillary refill takes less than 2 seconds.  Neurological:     General: No focal deficit present.     Mental Status: He is alert and oriented to person, place, and time.     ED Results / Procedures / Treatments   Labs (all labs ordered are listed, but only abnormal results are displayed) Labs Reviewed  URINALYSIS, ROUTINE W REFLEX MICROSCOPIC - Abnormal; Notable for the following components:      Result Value   Color, Urine COLORLESS (*)    All other components within normal limits  BASIC METABOLIC PANEL - Abnormal; Notable for  the following components:   Potassium 3.4 (*)    Glucose, Bld 115 (*)    All other components within normal limits  CBC    EKG None  Radiology CT Renal Stone Study  Result Date: 07/25/2022 CLINICAL DATA:  Intermittent left flank pain. History of kidney stones. EXAM: CT ABDOMEN AND PELVIS WITHOUT CONTRAST TECHNIQUE: Multidetector CT imaging of the abdomen and pelvis was performed following the standard protocol without IV contrast. RADIATION DOSE REDUCTION: This exam was performed according to the departmental dose-optimization program which includes automated exposure control, adjustment of the mA and/or kV according to patient size and/or use of iterative reconstruction technique. COMPARISON:  CT examination dated December 30, 2020 FINDINGS: Lower chest: No acute abnormality. Hepatobiliary: No focal liver abnormality is seen. No gallstones,  gallbladder wall thickening, or biliary dilatation. Pancreas: Unremarkable. No pancreatic ductal dilatation or surrounding inflammatory changes. Spleen: Normal in size without focal abnormality. Adrenals/Urinary Tract: Adrenal glands are unremarkable. Punctate nonobstructing calculus in the interpolar region of the right kidney. No evidence of hydronephrosis or ureteral calculus. Bladder is unremarkable. Stomach/Bowel: Stomach is within normal limits. No evidence of appendicitis. No evidence of bowel wall thickening, distention, or inflammatory changes. Vascular/Lymphatic: No significant vascular findings are present. No enlarged abdominal or pelvic lymph nodes. Reproductive: Prostate is unremarkable. Other: No abdominal wall hernia or abnormality. No abdominopelvic ascites. Musculoskeletal: No acute or significant osseous findings. IMPRESSION: 1. Punctate nonobstructing calculus in the interpolar region of the right kidney. No evidence of hydronephrosis or ureteral calculus. 2. No evidence of colitis or diverticulitis. 3. No CT evidence of acute abdominal/pelvic process. Electronically Signed   By: Larose Hires D.O.   On: 07/25/2022 17:45    Procedures Procedures    Medications Ordered in ED Medications - No data to display  ED Course/ Medical Decision Making/ A&P    21 year old here for evaluation of intermittent flank pain.  Had left flank pain on Friday.  Does have history of kidney stones he thought he has passed another kidney stone.  No pain since yesterday.  Unsure if he passed it.  No dysuria hematuria.  No chest pain, shortness of breath, cough to suggest atypical intrathoracic etiology.  No recent trauma or injuries.  He has been pain-free over the last 24 hours.  Labs personally viewed and interpreted:  CBC without leukocytosis metabolic panel potassium 3.4 UA negative for infection, no blood  Discussed with patient not ordering imaging given he has been pain-free however he would  like to "make sure there are no more."  We will do CT stone study.  CT stone study without any obstructing stone.  No stones up in his left kidney.  Is possible he passed a stone over the last few days however no acute pathology.  He is currently symptom free.  I discussed with him keep close monitor for symptoms, return for new or worsening symptoms.  They did not get hepatic function panel or lipase while in triage.  I offered to order this however he does not have an IV and does not want to be stuck again.  Given he is pain-free I feel this is reasonable.  He will keep close monitor symptoms, return for new or worsening.  Patient is nontoxic, nonseptic appearing, in no apparent distress.  Patient's pain and other symptoms adequately managed in emergency department.  Fluid bolus given.  Labs, imaging and vitals reviewed.  Patient does not meet the SIRS or Sepsis criteria.  On repeat exam patient does not  have a surgical abdomin and there are no peritoneal signs.  No indication of appendicitis, bowel obstruction, bowel perforation, cholecystitis, diverticulitis.  Patient discharged home with symptomatic treatment and given strict instructions for follow-up with their primary care physician.  I have also discussed reasons to return immediately to the ER.  Patient expresses understanding and agrees with plan.                            Medical Decision Making Amount and/or Complexity of Data Reviewed External Data Reviewed: labs, radiology and notes. Labs: ordered. Decision-making details documented in ED Course. Radiology: ordered and independent interpretation performed. Decision-making details documented in ED Course.  Risk OTC drugs. Decision regarding hospitalization. Diagnosis or treatment significantly limited by social determinants of health.         Final Clinical Impression(s) / ED Diagnoses Final diagnoses:  Flank pain    Rx / DC Orders ED Discharge Orders     None          Ainhoa Rallo A, PA-C 07/25/22 1841    Ernie Avena, MD 07/25/22 2012

## 2022-12-01 ENCOUNTER — Other Ambulatory Visit: Payer: Self-pay

## 2022-12-01 ENCOUNTER — Ambulatory Visit (HOSPITAL_COMMUNITY)
Admission: EM | Admit: 2022-12-01 | Discharge: 2022-12-01 | Disposition: A | Discharge status: Home / Self Care | Payer: Self-pay | Attending: Emergency Medicine | Admitting: Emergency Medicine

## 2022-12-01 ENCOUNTER — Encounter (HOSPITAL_COMMUNITY): Payer: Self-pay | Admitting: Emergency Medicine

## 2022-12-01 DIAGNOSIS — J069 Acute upper respiratory infection, unspecified: Secondary | ICD-10-CM

## 2022-12-01 DIAGNOSIS — S0001XA Abrasion of scalp, initial encounter: Secondary | ICD-10-CM

## 2022-12-01 MED ORDER — AMOXICILLIN-POT CLAVULANATE 875-125 MG PO TABS: 1.0000 | ORAL_TABLET | Freq: Two times a day (BID) | ORAL | 0 refills | Status: AC

## 2022-12-01 NOTE — ED Provider Notes (Signed)
[facility]    CSN: [id number] Arrival date & time: 12/01/22  1334      History   Chief Complaint Chief Complaint  Patient presents with   Cough    HPI Darrell Zamora is a 21 y.o. male.   Patient presents for evaluation of nasal congestion, rhinorrhea, sore throat and a productive cough for 2 weeks, symptoms were improving but worsened 1 day ago and he cough has become productive with orange to yellow sputum.  Has begun to experience shortness of breath with exertion causing lightheadedness.  Tested positive for COVID 2 weeks ago.  Tolerating food and liquids.  Has been managing symptoms with Tylenol.  Unrelated to above symptoms, patient had a fall in shower last night, slipped and hit head.  Denies loss of consciousness.  Bleeding controlled.  Denies signs of concussion today.    History reviewed. No pertinent past medical history.  There are no problems to display for this patient.   Past Surgical History:  Procedure Laterality Date   KNEE SURGERY         Home Medications    Prior to Admission medications   Medication Sig Start Date End Date Taking? Authorizing Provider  Diclofenac Sodium CR 100 MG 24 hr tablet Take 1 tablet (100 mg total) by mouth daily. Patient not taking: Reported on 12/01/2022 12/30/20   Niel Hummer, MD    Family History Family History  Adopted: Yes    Social History Social History   Tobacco Use   Smoking status: Some Days    Types: Cigars  Vaping Use   Vaping Use: Some days  Substance Use Topics   Alcohol use: Yes    Comment: 2-3 times per week   Drug use: Not Currently    Types: Marijuana    Comment: occasionally     Allergies   Patient has no known allergies.   Review of Systems Review of Systems  Constitutional: Negative.   HENT:  Positive for congestion, rhinorrhea and sore throat. Negative for dental problem, drooling, ear discharge, ear pain, facial swelling, hearing loss, mouth sores, nosebleeds,  postnasal drip, sinus pressure, sinus pain, sneezing, tinnitus, trouble swallowing and voice change.   Respiratory:  Positive for cough. Negative for apnea, choking, chest tightness, shortness of breath, wheezing and stridor.   Cardiovascular: Negative.   Skin:  Positive for wound. Negative for color change, pallor and rash.     Physical Exam Triage Vital Signs ED Triage Vitals  Enc Vitals Group     BP 12/01/22 1704 128/84     Pulse Rate 12/01/22 1704 88     Resp 12/01/22 1704 18     Temp 12/01/22 1704 98.4 F (36.9 C)     Temp Source 12/01/22 1704 Oral     SpO2 12/01/22 1704 98 %     Weight --      Height --      Head Circumference --      Peak Flow --      Pain Score 12/01/22 1700 7     Pain Loc --      Pain Edu? --      Excl. in GC? --    No data found.  Updated Vital Signs BP 128/84 (BP Location: Right Arm)   Pulse 88   Temp 98.4 F (36.9 C) (Oral)   Resp 18   SpO2 98%   Visual Acuity Right Eye Distance:   Left Eye Distance:   Bilateral Distance:  Right Eye Near:   Left Eye Near:    Bilateral Near:     Physical Exam Constitutional:      Appearance: Normal appearance.  HENT:     Head: Normocephalic.     Right Ear: Tympanic membrane, ear canal and external ear normal.     Left Ear: Tympanic membrane, ear canal and external ear normal.     Nose: Congestion and rhinorrhea present.     Mouth/Throat:     Mouth: Mucous membranes are moist.     Pharynx: Posterior oropharyngeal erythema present.     Tonsils: No tonsillar exudate. 0 on the right. 0 on the left.  Eyes:     Extraocular Movements: Extraocular movements intact.  Cardiovascular:     Rate and Rhythm: Normal rate and regular rhythm.     Pulses: Normal pulses.     Heart sounds: Normal heart sounds.  Pulmonary:     Effort: Pulmonary effort is normal.     Breath sounds: Normal breath sounds.  Skin:    Comments: 1 cm abrasion to the parietal region of the scalp, skin intact with scabbing present,  dried blood at site, nontender, nondraining  Neurological:     Mental Status: He is alert and oriented to person, place, and time. Mental status is at baseline.  Psychiatric:        Mood and Affect: Mood normal.        Behavior: Behavior normal.      UC Treatments / Results  Labs (all labs ordered are listed, but only abnormal results are displayed) Labs Reviewed - No data to display  EKG   Radiology No results found.  Procedures Procedures (including critical care time)  Medications Ordered in UC Medications - No data to display  Initial Impression / Assessment and Plan / UC Course  I have reviewed the triage vital signs and the nursing notes.  Pertinent labs & imaging results that were available during my care of the patient were reviewed by me and considered in my medical decision making (see chart for details).  Acute upper respiratory infection, abrasion scalp without infection  Etiology of symptoms most likely started off as a virus, known COVID-19 infection however as they have progressed for 14 days and are worsening most likely bacterial present, Augmentin prescribed, may use additional over-the-counter medication for supportive care with urgent care follow-up as needed  Skin is intact to scalp, abrasion has scabbed and has begun to heal with no signs of current infection, advised to monitor and follow-up as needed for reevaluation, no current signs of concussion Final Clinical Impressions(s) / UC Diagnoses   Final diagnoses:  None   Discharge Instructions   None    ED Prescriptions   None    PDMP not reviewed this encounter.   Valinda Hoar, NP 12/01/22 1718

## 2022-12-01 NOTE — ED Triage Notes (Addendum)
Cough and sore throat for 2 weeks.  Tested positive for covid 2 weeks ago.  Patient was feeling better.  Yesterday evening, patient started feeling worse again. Patient reports a productive cough and reports color as "orange-green"unknown fever  Reports falling in shower last night, wound to scalp, bleeding controlled, denied loc

## 2022-12-01 NOTE — Discharge Instructions (Signed)
Symptoms were a part of the COVID-19 virus but as it has been 2 weeks and they have begun to worsen there is most likely bacterial prolonging this  Begin Augmentin every morning and evening for 7 days, ideally you start to see improvement in about 48 hours and steady progression from the    You can take Tylenol and/or Ibuprofen as needed for fever reduction and pain relief.   For cough: honey 1/2 to 1 teaspoon (you can dilute the honey in water or another fluid).  You can also use guaifenesin and dextromethorphan for cough. You can use a humidifier for chest congestion and cough.  If you don't have a humidifier, you can sit in the bathroom with the hot shower running.      For sore throat: try warm salt water gargles, cepacol lozenges, throat spray, warm tea or water with lemon/honey, popsicles or ice, or OTC cold relief medicine for throat discomfort.   For congestion: take a daily anti-histamine like Zyrtec, Claritin, and a oral decongestant, such as pseudoephedrine.  You can also use Flonase 1-2 sprays in each nostril daily.   It is important to stay hydrated: drink plenty of fluids (water, gatorade/powerade/pedialyte, juices, or teas) to keep your throat moisturized and help further relieve irritation/discomfort.

## 2023-01-26 ENCOUNTER — Emergency Department (HOSPITAL_BASED_OUTPATIENT_CLINIC_OR_DEPARTMENT_OTHER): Payer: BLUE CROSS/BLUE SHIELD

## 2023-01-26 ENCOUNTER — Emergency Department (HOSPITAL_BASED_OUTPATIENT_CLINIC_OR_DEPARTMENT_OTHER)
Admission: EM | Admit: 2023-01-26 | Discharge: 2023-01-26 | Disposition: A | Discharge status: Home / Self Care | Payer: BLUE CROSS/BLUE SHIELD | Attending: Emergency Medicine | Admitting: Emergency Medicine

## 2023-01-26 DIAGNOSIS — R519 Headache, unspecified: Secondary | ICD-10-CM | POA: Diagnosis present

## 2023-01-26 DIAGNOSIS — Y92410 Unspecified street and highway as the place of occurrence of the external cause: Secondary | ICD-10-CM | POA: Diagnosis not present

## 2023-01-26 DIAGNOSIS — G44319 Acute post-traumatic headache, not intractable: Secondary | ICD-10-CM | POA: Diagnosis not present

## 2023-01-26 NOTE — ED Triage Notes (Signed)
C-collar applied in triage.

## 2023-01-26 NOTE — ED Notes (Signed)
Patient ambulatory with steady gait back to room, with c-collar in place.
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
## 2023-01-26 NOTE — Discharge Instructions (Signed)
He will hurt worse tomorrow.  That is normal.  Please return for worsening confusion intractable vomiting difficulty breathing or severe abdominal pain.  If you are still having significant pain in about a week then please let your family doctor know.  They may consider other imaging or referral if needed.  Take 4 over the counter ibuprofen tablets 3 times a day or 2 over-the-counter naproxen tablets twice a day for pain. Also take tylenol 1046m(2 extra strength) four times a day.
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
## 2023-10-29 ENCOUNTER — Emergency Department (HOSPITAL_BASED_OUTPATIENT_CLINIC_OR_DEPARTMENT_OTHER)
Admission: EM | Admit: 2023-10-29 | Discharge: 2023-10-29 | Disposition: A | Discharge status: Home / Self Care | Payer: BLUE CROSS/BLUE SHIELD | Attending: Emergency Medicine | Admitting: Emergency Medicine

## 2023-10-29 ENCOUNTER — Other Ambulatory Visit: Payer: Self-pay

## 2023-10-29 DIAGNOSIS — Z20822 Contact with and (suspected) exposure to covid-19: Secondary | ICD-10-CM | POA: Insufficient documentation

## 2023-10-29 DIAGNOSIS — J029 Acute pharyngitis, unspecified: Secondary | ICD-10-CM | POA: Diagnosis present

## 2023-10-29 DIAGNOSIS — J069 Acute upper respiratory infection, unspecified: Secondary | ICD-10-CM | POA: Diagnosis not present

## 2023-10-29 LAB — RESP PANEL BY RT-PCR (RSV, FLU A&B, COVID)  RVPGX2
Influenza A by PCR: NEGATIVE
Influenza B by PCR: NEGATIVE
Resp Syncytial Virus by PCR: NEGATIVE
SARS Coronavirus 2 by RT PCR: NEGATIVE

## 2023-10-29 MED ORDER — ACETAMINOPHEN 325 MG PO TABS
650.0000 mg | ORAL_TABLET | Freq: Once | ORAL | Status: AC
  Administered 2023-10-29: 650 mg via ORAL
  Filled 2023-10-29: qty 2

## 2023-10-29 NOTE — ED Triage Notes (Signed)
Pt. States he is having middle to low back pain. Pt. States he feels like he has flu. Pt. C/o headache, chills, lethargy for past few days.

## 2023-10-29 NOTE — Discharge Instructions (Signed)
Please use Tylenol or ibuprofen for pain, fever.  You may use 600 mg ibuprofen every 6 hours or 1000 mg of Tylenol every 6 hours.  You may choose to alternate between the 2.  This would be most effective.  Not to exceed 4 g of Tylenol within 24 hours.  Not to exceed 3200 mg ibuprofen 24 hours.  Make sure that you are drinking plenty of fluids.

## 2023-10-29 NOTE — ED Provider Notes (Signed)
[place] EMERGENCY DEPARTMENT AT [facility] Provider Note   CSN: [id number] Arrival date & time: 10/29/23  1936     History  Chief Complaint  Patient presents with   Back Pain    Carroll Nevel is a 22 y.o. male with noncontributory past medical history presents with concern for flulike symptoms for 1 day, sore throat, cough, back pain.  Reports his brother was recently sick.  He is requesting a note for work.  He denies any chest pain, shortness of breath.  He denies any previous medical history.  He denies any nausea, vomiting, diarrhea.   Back Pain      Home Medications Prior to Admission medications   Medication Sig Start Date End Date Taking? Authorizing Provider  amoxicillin-clavulanate (AUGMENTIN) 875-125 MG tablet Take 1 tablet by mouth every 12 (twelve) hours. 12/01/22   Valinda Hoar, NP  Diclofenac Sodium CR 100 MG 24 hr tablet Take 1 tablet (100 mg total) by mouth daily. Patient not taking: Reported on 12/01/2022 12/30/20   Niel Hummer, MD      Allergies    Patient has no known allergies.    Review of Systems   Review of Systems  Musculoskeletal:  Positive for back pain.  All other systems reviewed and are negative.   Physical Exam Updated Vital Signs BP (!) 142/93   Pulse (!) 113   Temp 99.2 F (37.3 C)   Resp 18   Ht 5\' 6"  (1.676 m)   Wt 49.9 kg   SpO2 100%   BMI 17.75 kg/m  Physical Exam Vitals and nursing note reviewed.  Constitutional:      General: He is not in acute distress.    Appearance: Normal appearance.  HENT:     Head: Normocephalic and atraumatic.     Mouth/Throat:     Comments: moderate posterior oropharynx erythema without swelling, exudate. Uvula midline, tonsils absent bilaterally.  No trismus, stridor, evidence of PTA, floor of mouth swelling or redness.   Eyes:     General:        Right eye: No discharge.        Left eye: No discharge.  Cardiovascular:     Rate and Rhythm: Regular rhythm. Tachycardia  present.     Heart sounds: No murmur heard.    No friction rub. No gallop.  Pulmonary:     Effort: Pulmonary effort is normal.     Breath sounds: Normal breath sounds.     Comments: No wheezing, rhonchi, stridor, rales Abdominal:     General: Bowel sounds are normal.     Palpations: Abdomen is soft.  Skin:    General: Skin is warm and dry.     Capillary Refill: Capillary refill takes less than 2 seconds.  Neurological:     Mental Status: He is alert and oriented to person, place, and time.  Psychiatric:        Mood and Affect: Mood normal.        Behavior: Behavior normal.     ED Results / Procedures / Treatments   Labs (all labs ordered are listed, but only abnormal results are displayed) Labs Reviewed  RESP PANEL BY RT-PCR (RSV, FLU A&B, COVID)  [id number]    EKG None  Radiology No results found.  Procedures Procedures    Medications Ordered in ED Medications  acetaminophen (TYLENOL) tablet 650 mg (650 mg Oral Given 10/29/23 2023)    ED Course/ Medical Decision Making/ A&P  Medical Decision Making Risk OTC drugs.   This is a well-appearing 22 yo male who presents with concern for 1 days of cough, fever, sore throat, headache, myalgias.  My emergent differential diagnosis includes acute upper respiratory infection with COVID, flu, RSV versus new asthma presentation, acute bronchitis, less clinical concern for pneumonia.  Also considered other ENT emergencies, Ludwig angina, strep pharyngitis, mono, versus epiglottis, tonsillitis versus other.  This is not an exhaustive differential.  On my exam patient is overall well-appearing, they have temperature of 99.2, breathing unlabored, no tachypnea, no respiratory distress, stable oxygen saturation.  Patient with mild tachycardia.  Bilateral TMs are clear.  RVP independently reviewed by myself shows negative for covid, flu, RSV.  Patient symptoms are consistent with viral uri.  Encouraged  ibuprofen, Tylenol, rest, plenty of fluids.  Discussed extensive return precautions.  Patient discharged in stable condition at this time.  Final Clinical Impression(s) / ED Diagnoses Final diagnoses:  Viral URI with cough    Rx / DC Orders ED Discharge Orders     None         [place] 10/29/23 2104    Lonell Grandchild, MD 10/30/23 1501

## 2023-11-05 ENCOUNTER — Ambulatory Visit (HOSPITAL_COMMUNITY)
Admission: EM | Admit: 2023-11-05 | Discharge: 2023-11-05 | Disposition: A | Discharge status: Home / Self Care | Payer: BLUE CROSS/BLUE SHIELD

## 2023-11-05 DIAGNOSIS — F1090 Alcohol use, unspecified, uncomplicated: Secondary | ICD-10-CM | POA: Diagnosis not present

## 2023-11-05 DIAGNOSIS — F331 Major depressive disorder, recurrent, moderate: Secondary | ICD-10-CM | POA: Diagnosis not present

## 2023-11-05 DIAGNOSIS — F411 Generalized anxiety disorder: Secondary | ICD-10-CM
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
## 2023-11-05 NOTE — ED Provider Notes (Signed)
Behavioral Health Urgent Care Medical Screening Exam  Patient Name: Darrell Zamora MRN: [id number] Date of Evaluation: 11/05/23 Chief Complaint: "Alcohol use is getting out of hand." Diagnosis:  Final diagnoses:  Alcohol use disorder  MDD (major depressive disorder), recurrent episode, moderate (HCC)  Anxiety state    History of Present illness: Darrell Zamora is a 22 y.o. male with a past psychiatric history significant for depression, anxiety, and PTSD who presents to [facility] with a chief complaint of "alcohol use is getting out of hand."  Patient presents to the encounter stating that he is not in the best head space.  He reports that that he has been consuming alcohol and that it has gotten out of hand.  Patient also reports that he uses marijuana, but states that it is not a concern at this time.  Patient has been abusing alcohol for the past 6 to 7 years.  He reports that he has been drinking daily since his last birthday.  The patient consumes anywhere between a pint to a pint and a half of alcohol a day.  Patient states that he drinks Twisted Teas which has the same alcohol content of the beer.  He reports that he last drank last night and is currently not experiencing any withdrawal symptoms.  The patient reports that he is usually following after going 24 hours without drinking but after day 2, patient states that he experiences tremors, headaches, and feeling ill.  Patient endorses depression and rates his depression an 8 out of 10 with 10 being most severe.  Patient endorses depressive episodes every day.  Patient endorses the following depressive symptoms: feelings of sadness, lack of motivation, decreased concentration, irritability, decreased energy, feelings of guilt/worthlessness, and hopelessness.  Patient denies any alleviating factors to his depression.  And states that he does not have coping skills to help alcohol abuse.  Patient states that he  often relies on alcohol to help improve his mood.  Patient reports that his depression is worsened by alcohol in the long-term.  Patient also endorses anxiety and rates his anxiety as 5 out of 10.  Patient's current stressors include financial instability, work, family, and taking care of his 34-month-old daughter.  Patient is not currently on any medications at this time.  Patient reports that he has been on depressive medications in the past but never took them.  He reports that he has also done counseling in the past but it has been some years since he has participated in counseling.  Patient reports that he was diagnosed with depression, anxiety, and PTSD at the age of 57.  Patient endorses a past history of medication management but states that he never followed through with taking the medication.  Patient denies a past history of suicide attempt.  Patient endorses a past history of self-harm stating that he used to cut his arms at the age of 52 or 11.  Patient is alert and oriented x 4, calm, cooperative, and fully engaged in conversation during the encounter.  Patient maintains good eye contact.  Patient's speech is clear, coherent, and with normal rate.  His thought process is coherent and goal directed.  His thought content is within normal limits.  Patient endorses depressed mood with anxiety and exhibits congruent affect.  Patient denies suicidal or homicidal ideations.  He further denies auditory or visual hallucinations and does not appear to be responding to internal/external stimuli.  Patient endorses poor sleep and receives on average 3  to 6 hours of sleep per night.  Patient endorses fair appetite and eats on average 2 meals per day.  Patient endorses alcohol consumption and states that the last drink yesterday.  Patient is denying active withdrawal symptoms at this time.  Patient endorses tobacco use and smokes on average 2 cigarettes/day as well as engaging in vaping.  Patient endorses illicit  drug use in the form of marijuana and smokes on average a gram per day.  Flowsheet Row ED from 11/05/2023 in [facility] ED from 10/29/2023 in [facility] Emergency Department at [facility] ED from 01/26/2023 in [facility] Emergency Department at [facility]  C-SSRS RISK CATEGORY No Risk No Risk No Risk       Psychiatric Specialty Exam  Presentation  General Appearance:Appropriate for Environment  Eye Contact:Good  Speech:Clear and Coherent; Normal Rate  Speech Volume:Normal  Handedness:Right   Mood and Affect  Mood: Anxious; Depressed  Affect: Congruent   Thought Process  Thought Processes: Coherent; Goal Directed  Descriptions of Associations:Intact  Orientation:Full (Time, Place and Person)  Thought Content:WDL    Hallucinations:None  Ideas of Reference:None  Suicidal Thoughts:No  Homicidal Thoughts:No   Sensorium  Memory: Immediate Good; Recent Good; Remote Good  Judgment: Good  Insight: Good   Executive Functions  Concentration: Good  Attention Span: Good  Recall: Good  Fund of Knowledge: Good  Language: Good   Psychomotor Activity  Psychomotor Activity: Normal   Assets  Assets: Communication Skills; Desire for Improvement; Financial Resources/Insurance; Housing; Physical Health; Resilience; Social Support; Vocational/Educational   Sleep  Sleep: Fair  Number of hours:  4.5 (Patient reports that he receives between 3 and 6 hours of sleep)   Physical Exam: Physical Exam Constitutional:      Appearance: Normal appearance.  HENT:     Head: Normocephalic and atraumatic.     Nose: Nose normal.     Mouth/Throat:     Mouth: Mucous membranes are moist.  Eyes:     Extraocular Movements: Extraocular movements intact.  Cardiovascular:     Rate and Rhythm: Normal rate and regular rhythm.  Pulmonary:     Effort: Pulmonary effort is normal.  Abdominal:     General: Abdomen  is flat.  Musculoskeletal:        General: Normal range of motion.     Cervical back: Normal range of motion.  Skin:    General: Skin is warm and dry.  Neurological:     General: No focal deficit present.     Mental Status: He is alert and oriented to person, place, and time.  Psychiatric:        Attention and Perception: Attention and perception normal. He does not perceive auditory or visual hallucinations.        Mood and Affect: Mood is anxious and depressed. Affect is blunt.        Speech: Speech normal.        Behavior: Behavior normal. Behavior is cooperative.        Thought Content: Thought content normal. Thought content is not paranoid or delusional. Thought content does not include homicidal or suicidal ideation. Thought content does not include suicidal plan.        Cognition and Memory: Cognition and memory normal.        Judgment: Judgment normal.    Review of Systems  Constitutional: Negative.   HENT: Negative.    Eyes: Negative.   Respiratory: Negative.    Cardiovascular: Negative.  Gastrointestinal: Negative.   Skin: Negative.   Neurological: Negative.   Psychiatric/Behavioral:  Positive for depression and substance abuse. Negative for hallucinations and suicidal ideas. The patient is nervous/anxious. The patient does not have insomnia.    Blood pressure 126/89, pulse 87, temperature 98.5 F (36.9 C), temperature source Oral, resp. rate 20, SpO2 100%. There is no height or weight on file to calculate BMI.  Musculoskeletal: Strength & Muscle Tone: within normal limits Gait & Station: normal Patient leans: N/A   [facility] MSE Discharge Disposition for Follow up and Recommendations: Based on my evaluation the patient does not appear to have an emergency medical condition and can be discharged with resources and follow up care in outpatient services for Medication Management, Substance Abuse Intensive Outpatient Program, and Individual Therapy.  Meta Hatchet,  PA 11/05/2023, 9:41 PM
